# Patient Record
Sex: Male | Born: 1960 | Race: White | Hispanic: No | Marital: Married | Smoking: Never smoker
Health system: Southern US, Community
[De-identification: ages and names within clinical notes are randomized; demographics above are authoritative.]

---

## 1999-01-29 ENCOUNTER — Emergency Department (HOSPITAL_COMMUNITY): Admission: EM | Admit: 1999-01-29 | Discharge: 1999-01-29 | Payer: Self-pay | Admitting: Emergency Medicine

## 1999-01-29 ENCOUNTER — Encounter: Payer: Self-pay | Admitting: Emergency Medicine

## 2012-06-07 ENCOUNTER — Encounter (HOSPITAL_COMMUNITY): Payer: Self-pay | Admitting: *Deleted

## 2012-06-07 ENCOUNTER — Emergency Department (HOSPITAL_COMMUNITY)
Admission: EM | Admit: 2012-06-07 | Discharge: 2012-06-07 | Disposition: A | Discharge status: Home / Self Care | Payer: 59 | Attending: Emergency Medicine | Admitting: Emergency Medicine

## 2012-06-07 ENCOUNTER — Emergency Department (HOSPITAL_COMMUNITY): Payer: 59

## 2012-06-07 DIAGNOSIS — Y93H9 Activity, other involving exterior property and land maintenance, building and construction: Secondary | ICD-10-CM | POA: Insufficient documentation

## 2012-06-07 DIAGNOSIS — W298XXA Contact with other powered powered hand tools and household machinery, initial encounter: Secondary | ICD-10-CM | POA: Insufficient documentation

## 2012-06-07 DIAGNOSIS — S61209A Unspecified open wound of unspecified finger without damage to nail, initial encounter: Secondary | ICD-10-CM | POA: Insufficient documentation

## 2012-06-07 DIAGNOSIS — Y998 Other external cause status: Secondary | ICD-10-CM | POA: Insufficient documentation

## 2012-06-07 DIAGNOSIS — S61012A Laceration without foreign body of left thumb without damage to nail, initial encounter: Secondary | ICD-10-CM

## 2012-06-07 MED ORDER — TETANUS-DIPHTH-ACELL PERTUSSIS 5-2.5-18.5 LF-MCG/0.5 IM SUSP
0.5000 mL | Freq: Once | INTRAMUSCULAR | Status: AC
  Administered 2012-06-07: 0.5 mL via INTRAMUSCULAR
  Filled 2012-06-07: qty 0.5

## 2012-06-07 NOTE — ED Provider Notes (Signed)
History     CSN: [id number]  Arrival date & time 06/07/12  1153   First MD Initiated Contact with Patient 06/07/12 1208      Chief Complaint  Patient presents with  . Extremity Laceration    (Consider location/radiation/quality/duration/timing/severity/associated sxs/prior treatment) HPI Hx from pt. Dennis Olsen is a 51 y.o. male presenting with L thumb lac. States he was trimming hedges today when he accidentally caught his left thumb with the trimmer. Bleeding controlled. States he has minimal throbbing pain to the area. Denies numbness, weakness, difficulty moving the ext. Unsure of tetanus status.  History reviewed. No pertinent past medical history.  History reviewed. No pertinent past surgical history.  History reviewed. No pertinent family history.  History  Substance Use Topics  . Smoking status: Not on file  . Smokeless tobacco: Not on file  . Alcohol Use: Yes     occ      Review of Systems  Constitutional: Negative.   ros as per hpi  Allergies  Review of patient's allergies indicates no known allergies.  Home Medications  No current outpatient prescriptions on file.  BP 127/70  Pulse 56  Temp 97.4 F (36.3 C) (Oral)  Resp 20  SpO2 97%  Physical Exam  Nursing note and vitals reviewed. Constitutional: He appears well-developed and well-nourished. No distress.  HENT:  Head: Normocephalic and atraumatic.  Neck: Normal range of motion.  Cardiovascular: Normal rate.   Pulmonary/Chest: Effort normal.  Musculoskeletal: Normal range of motion.       Hands:      ~1.5 cm superficial appearing curvilinear lac to palmar surface of distal L thumb, bleeding controlled, clean appearing. Able to flex/ext thumb without difficulty, 5/5 strength at MCP/DIP. NVI distally, sensory intact to lt touch, cap refill <2 secs.  Neurological: He is alert.  Skin: Skin is warm and dry. He is not diaphoretic.  Psychiatric: He has a normal mood and affect.    ED Course    Procedures (including critical care time)  LACERATION REPAIR Performed by: Grant Fontana Authorized by: Grant Fontana Consent: Verbal consent obtained. Risks and benefits: risks, benefits and alternatives were discussed Consent given by: patient Patient identity confirmed: provided demographic data Prepped and Draped in normal sterile fashion Wound explored  Laceration Location: L thumb  Laceration Length: 1.5cm  No Foreign Bodies seen or palpated  Anesthesia: digital block  Local anesthetic: lidocaine 2% without epinephrine  Anesthetic total: 2 ml  Irrigation method: syringe Amount of cleaning: standard  Skin closure: 5-0 Prolene  Number of sutures: 7  Technique: simple interrupted  Patient tolerance: Patient tolerated the procedure well with no immediate complications.  Labs Reviewed - No data to display Dg Finger Thumb Left  06/07/2012  *RADIOLOGY REPORT*  Clinical Data: Laceration  LEFT THUMB 2+V  Comparison: None.  Findings: No radiodense foreign body. Negative for fracture, dislocation, or other acute abnormality.  Normal alignment and mineralization. No significant degenerative change.  Regional soft tissues unremarkable.  IMPRESSION:  Negative  Original Report Authenticated By: Thora Lance III, M.D.     1. Laceration of thumb, left       MDM  Pt presents with lac to the thumb. No evidence of tendon involvement, nvi distally with good cap refill. Imaging reviewed by me, no fx or evidence FB. Tet updated. Wound closed with sutures, pt tolerated well. Placed in bulky dressing. Instructed on wound care, suture removal. Reasons to return discussed. Pt verbalized understanding, agreed to plan.  Grant Fontana, PA-C 06/07/12 1345

## 2012-06-07 NOTE — ED Notes (Signed)
Pt reports trimming hedges today and cut left thumb, large laceration noted, bleeding controlled at this time.

## 2012-06-11 NOTE — ED Provider Notes (Signed)
Medical screening examination/treatment/procedure(s) were performed by non-physician practitioner and as supervising physician I was immediately available for consultation/collaboration.  Hurman Horn, MD 06/11/12 1224

## 2018-02-11 ENCOUNTER — Encounter: Payer: Self-pay | Admitting: Sports Medicine

## 2018-02-11 ENCOUNTER — Ambulatory Visit (INDEPENDENT_AMBULATORY_CARE_PROVIDER_SITE_OTHER): Payer: 59 | Admitting: Sports Medicine

## 2018-02-11 VITALS — BP 120/70 | Ht 76.0 in | Wt 195.0 lb

## 2018-02-11 DIAGNOSIS — S76211A Strain of adductor muscle, fascia and tendon of right thigh, initial encounter: Secondary | ICD-10-CM

## 2018-02-12 ENCOUNTER — Encounter: Payer: Self-pay | Admitting: Sports Medicine

## 2018-02-12 NOTE — Progress Notes (Signed)
   Subjective:    Patient ID: Dennis Olsen, male    DOB: 23-Sep-1961, 57 y.o.   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 While jumping up to hit an overhead shot, he felt a snap and pull in the right groin. He had immediate pain and was unable to continue playing. At the time of his injury, he did not notice any bruising or swelling. He has been self treating with ice and Aleve, and as a result, his symptoms have improved by about 75%. He localizes all of his pain to the groin with radiating pain down the medial aspect of the thigh. No lateral hip pain. No posterior hip pain. No numbness or tingling. His symptoms are most noticeable when leaning forward. He has no pain with walking. He was able to work out on the elliptical yesterday without any problem. He denies any previous injury to the hip. Denies knee pain.  Past medical history reviewed Medications reviewed Allergies reviewed    Review of Systems    as above Objective:   Physical Exam  Well-developed, well-nourished. No acute distress. Awake alert and oriented 3. Vital signs reviewed  Right hip: Smooth painless hip range of motion with a negative logroll. He is tender to palpation along the musculotendinous junction of the abductor muscle group. No swelling. No palpable defect. No ecchymosis. Reproducible pain with resisted hip adduction but no significant weakness. He does have some weakness with resisted hip abduction and a mildly positive Trendelenburg. He is neurovascularly intact distally. Walking without significant limp.       Assessment & Plan:   Right pain secondary to abductor muscle strain Hip abductor weakness  Since the patient is 75% improved, I think he will go on to complete resolution of symptoms rather quickly. We did give him some eccentric hip adductor exercises and  a compression sleeve to wear with activity. We also gave him hip abductor strengthening exercises and pelvic stabilizer exercises. He may continue with over-the-counter Aleve as needed. I think he is okay to wean back into activity as tolerated but I've cautioned him about returning to competitive tennis too soon. I think it may be a couple of weeks before he is able to return to competition on the tennis courts. No imaging was performed today but if symptoms persist or worsen then I would start with getting an MS[facility] ultrasound and possibly an x-ray.

## 2018-06-25 ENCOUNTER — Ambulatory Visit (INDEPENDENT_AMBULATORY_CARE_PROVIDER_SITE_OTHER): Payer: 59 | Admitting: Sports Medicine

## 2018-06-25 ENCOUNTER — Encounter: Payer: Self-pay | Admitting: Sports Medicine

## 2018-06-25 VITALS — BP 110/78 | Ht 76.0 in | Wt 200.0 lb

## 2018-06-25 DIAGNOSIS — M7701 Medial epicondylitis, right elbow: Secondary | ICD-10-CM

## 2018-06-25 NOTE — Patient Instructions (Signed)
Today we diagnosed you with medial epicondylitis. I am recommend eccentric strengthening exercises to be completed twice per day for 4 weeks. You may wear your strap as we discussed just past your medial epicondyle. You may also take anti-inflammatories as needed. Once you are improving, we may have Ellamae SiaJohn O'Halloran look at your golf swing in PT to help make adjustments and prevent this from reoccurring.

## 2018-06-25 NOTE — Progress Notes (Signed)
  Dennis Olsen - 57 y.o. male MRN [id number]  Date of birth: November 17, 1960   Chief complaint: R medial elbow pain  SUBJECTIVE:    History of present illness: Dennis Olsen is a 57 year old male with past medical history significant for medial epicondylitis approximately 1/2 years ago who presents today with a chief complaint of right-sided medial elbow pain.  His symptoms have been present for approximately 1 month and appeared not to be improving.  He states his pain is dull and located on the medial epicondyle region.  It is nonradiating.  He has had pain like this in the past.  His most recent episode was approximately 1/2 years ago which took almost a year to fully improve.  He has not tried any treatment today including icing or anti-inflammatories.  He has modified his activities in the gym to the point where he is no longer doing biceps curls as they were acutely painful.  He also tried CBD oil which did not work. He is an avid Armed forces operational officertennis player, golfer, and weight lifter on a weekly basis.  He usually hits 9-18 holes per week and plays 1-2 games of tennis per week.  He has purchased a elbow strap however he has not yet worn it.  He denies any significant weakness of the right upper extremity.  Denies any numbness or tingling.  Denies any pain at the wrist or at the shoulder.  No one injury that he remembers.   Review of systems:  As stated above   Interval past medical history, surgical history, family history, and social history obtained and are unchanged. PMH: none. Nonsmoker.  OBJECTIVE:  Physical exam: Vital signs are reviewed. BP 110/78   Ht 6\' 4"  (1.93 m)   Wt 200 lb (90.7 kg)   BMI 24.34 kg/m   Gen.: Alert, oriented, appears stated age, in no apparent distress Respiratory: Normal respirations, able to speak in full sentences Cardiac: Regular rate, distal pulses 2+ Musculoskeletal: No obvious abnormality upon inspection.    ASSESSMENT & PLAN: Medial epicondylitis of right elbow -  injury related to overuse - recommend eccentric exercises which were given and should be performed 2x day for 4 weeks. If unable to perform at home, recommend physical therapy for this - afterwards, recommend apt with Ellamae SiaJohn O'Halloran to review golf swing for preventative means - may wear forearm brace with activities - avoid chin ups or supination curls, may try hammer curls - may take ibuprofen as needed for pain or discomfort - If slow to improve, will consider US at next apt - f/u in 4 weeks   Gustavus MessingAJ Pinney, DO Sports Medicine Fellow [facility]

## 2018-06-25 NOTE — Assessment & Plan Note (Signed)
-   injury related to overuse - recommend eccentric exercises which were given and should be performed 2x day for 4 weeks. If unable to perform at home, recommend physical therapy for this - afterwards, recommend apt with Ellamae SiaJohn O'Halloran to review golf swing for preventative means - may wear forearm brace with activities - avoid chin ups or supination curls, may try hammer curls - may take ibuprofen as needed for pain or discomfort - If slow to improve, will consider US at next apt - f/u in 4 weeks

## 2019-09-29 ENCOUNTER — Encounter: Payer: 59 | Admitting: Sports Medicine

## 2019-10-06 ENCOUNTER — Encounter: Payer: Self-pay | Admitting: Sports Medicine

## 2019-10-06 ENCOUNTER — Ambulatory Visit (INDEPENDENT_AMBULATORY_CARE_PROVIDER_SITE_OTHER): Payer: 59 | Admitting: Sports Medicine

## 2019-10-06 ENCOUNTER — Other Ambulatory Visit: Payer: Self-pay

## 2019-10-06 VITALS — BP 110/78 | Ht 76.0 in | Wt 200.0 lb

## 2019-10-06 DIAGNOSIS — M2142 Flat foot [pes planus] (acquired), left foot: Secondary | ICD-10-CM

## 2019-10-06 DIAGNOSIS — M25561 Pain in right knee: Secondary | ICD-10-CM

## 2019-10-06 DIAGNOSIS — M25562 Pain in left knee: Secondary | ICD-10-CM | POA: Diagnosis not present

## 2019-10-06 DIAGNOSIS — M2141 Flat foot [pes planus] (acquired), right foot: Secondary | ICD-10-CM | POA: Diagnosis not present

## 2019-10-06 NOTE — Progress Notes (Signed)
PCP: Gaynelle Arabian, MD  Subjective:   HPI: Patient is a 58 y.o. male here for evaluation of bilateral knee pain.  Patient notes his pain is been present for the last month.  He denies any specific injury or trauma.  Pain is located in and around his kneecap.  Patient notes the pain worsens with squatting as well as with playing tennis.  The pain does not radiate.  He denies any bruising or swelling.  He denies any numbness or tingling.  Patient has not done any therapies for his pain other than taking over-the-counter anti-inflammatories.  He notes this minimally helps improve his pain.  He denies any locking, catching, instability of the knee.  He does not feel like the knee will give out on him.  The pain has a sharp stabbing quality to it.  Patient's current pain is a 1 out of 10 but can get severe enough to where he is not able to play tennis.   Review of Systems: See HPI above.  History reviewed. No pertinent past medical history.  No current outpatient medications on file prior to visit.   No current facility-administered medications on file prior to visit.     History reviewed. No pertinent surgical history.  No Known Allergies  Social History   Socioeconomic History  . Marital status: Single    Spouse name: Not on file  . Number of children: Not on file  . Years of education: Not on file  . Highest education level: Not on file  Occupational History  . Not on file  Social Needs  . Financial resource strain: Not on file  . Food insecurity    Worry: Not on file    Inability: Not on file  . Transportation needs    Medical: Not on file    Non-medical: Not on file  Tobacco Use  . Smoking status: Never Smoker  . Smokeless tobacco: Never Used  Substance and Sexual Activity  . Alcohol use: Yes    Comment: occ  . Drug use: No  . Sexual activity: Not on file  Lifestyle  . Physical activity    Days per week: Not on file    Minutes per session: Not on file  . Stress:  Not on file  Relationships  . Social Herbalist on phone: Not on file    Gets together: Not on file    Attends religious service: Not on file    Active member of club or organization: Not on file    Attends meetings of clubs or organizations: Not on file    Relationship status: Not on file  . Intimate partner violence    Fear of current or ex partner: Not on file    Emotionally abused: Not on file    Physically abused: Not on file    Forced sexual activity: Not on file  Other Topics Concern  . Not on file  Social History Narrative  . Not on file    History reviewed. No pertinent family history.      Objective:  Physical Exam: BP 110/78   Ht 6\' 4"  (1.93 m)   Wt 200 lb (90.7 kg)   BMI 24.34 kg/m  Gen: NAD, comfortable in exam room Lungs: Breathing comfortably on room air Knee Exam Right -Inspection: no deformity, no discoloration -Palpation: No tenderness palpation -ROM: Extension: -10 degrees; Flexion: 150 degrees.  Crepitus with range of motion testing -Strength: Extension: 5/5; Flexion: 5/5 -Special Tests: Varus Stress:  Negative; Valgus Stress: Negative; Lachman: Negative; Posterior drawer: Negative; McMurray: Negative; Thessaly: Negative -Limb neurovascularly intact, no instability noted  Contralateral Knee -Inspection: no deformity, no discoloration -Palpation: No tenderness palpation -ROM: Extension: -10 degrees; Flexion: 150 degrees.  Crepitus with range of motion testing -Strength: Extension: 5/5; Flexion: 5/5 -Special Tests: Varus Stress: Negative; Valgus Stress: Negative; Lachman: Negative; Posterior drawer: Negative; McMurray: Negative; Thessaly: Negative -Limb neurovascularly intact, no instability noted  Bilateral feet: Pes planus deformity   Diagnostic ultrasound of the bilateral knees Findings: -Normal amount of fluid noted within the suprapatellar pouch bilaterally -Normal appearance of the quadriceps tendon bilaterally -Thickening of  the patellar tendon along with hypoechoic changes noted in the right knee.  Left patellar tendon is normal -Normal appearance of the medial meniscus bilaterally.  No spurring noted -Small bone spurs noted in the right lateral joint line.  Normal appearance of the right lateral meniscus -Normal appearance of the left lateral meniscus -No Baker's cyst noted in the popliteal fossa bilaterally Impression: -Mild thickening and hypoechoic changes of the right patellar tendon.  Otherwise normal ultrasound examination of the bilateral knees    Assessment & Plan:  Patient is a 58 y.o. male here for evaluation of bilateral knee pain  1.  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  Will call the patient with the results after this is done -Patient was given knee strengthening exercises  2.  Bilateral pes planus deformity Patient was fitted for a : standard, cushioned, semi-rigid orthotic. The orthotic was heated and afterward the patient stood on the orthotic blank positioned on the orthotic stand. The patient was positioned in subtalar neutral position and 10 degrees of ankle dorsiflexion in a weight bearing stance. After completion of molding, a stable base was applied to the orthotic blank. The blank was ground to a stable position for weight bearing. Size: 13 Base: Blue medium density EVA Posting: None Additional orthotic padding: None -Patient was instructed to wear the orthotics for the next several weeks.  If he develops any discomfort and then he will come back for adjustments as needed  We will call patient with results of the x-ray and discuss further management follow-up at that time  Patient seen and evaluated with the sports  medicine fellow.  I agree with the above plan of care.  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  I discussed avoiding squats and lunges if they become too painful.  He also has pes planus so I think he would benefit from arch support.  Custom orthotics were created today for him.  He found them comfortable prior to leaving the office.  He will get x-rays of his knee and I will call him with those results when available.  He was also instructed in isometric quad exercises.  I think he is okay to continue with activity using pain as his guide.

## 2019-10-09 ENCOUNTER — Ambulatory Visit
Admission: RE | Admit: 2019-10-09 | Discharge: 2019-10-09 | Disposition: A | Discharge status: Home / Self Care | Payer: 59 | Source: Ambulatory Visit | Attending: Sports Medicine | Admitting: Sports Medicine

## 2019-10-09 DIAGNOSIS — M25562 Pain in left knee: Secondary | ICD-10-CM

## 2019-10-09 DIAGNOSIS — M25561 Pain in right knee: Secondary | ICD-10-CM

## 2019-10-12 ENCOUNTER — Telehealth: Payer: Self-pay | Admitting: Sports Medicine

## 2019-10-12 NOTE — Telephone Encounter (Signed)
  I left a message on the patient's voicemail today regarding x-ray findings of his knees.  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  I reiterated the importance of avoiding squats and lunges as much as possible which will likely aggravate his symptoms.  If pain persists despite custom orthotics and home exercises then we could consider cortisone injections.  Follow-up as needed.

## 2019-10-28 ENCOUNTER — Ambulatory Visit: Payer: 59 | Attending: Internal Medicine

## 2019-10-28 DIAGNOSIS — Z20822 Contact with and (suspected) exposure to covid-19: Secondary | ICD-10-CM

## 2019-10-31 LAB — NOVEL CORONAVIRUS, NAA: SARS-CoV-2, NAA: NOT DETECTED

## 2020-02-02 ENCOUNTER — Other Ambulatory Visit: Payer: Self-pay

## 2020-02-02 ENCOUNTER — Ambulatory Visit (INDEPENDENT_AMBULATORY_CARE_PROVIDER_SITE_OTHER): Payer: 59 | Admitting: Family Medicine

## 2020-02-02 VITALS — BP 120/78 | Ht 76.0 in | Wt 200.0 lb

## 2020-02-02 DIAGNOSIS — M79672 Pain in left foot: Secondary | ICD-10-CM | POA: Diagnosis not present

## 2020-02-02 NOTE — Progress Notes (Signed)
PCP: Blair Heys, MD  Subjective:   HPI: Patient is a 59 y.o. male here for evaluation of left foot pain.  He complains of dorsomedial foot pain, that is worse with plantarflexion while walking. He was walking 5-6 miles about a month ago when the pain began and it has continued to bother him while he plays golf or exercises. He has not taken any medications for it and the only thing that helps is rest.  Patient was fitted with standard, cushioned, semi rigid orthotics in December 2020 for bilateral pes planus deformity.  No past medical history on file.  No current outpatient medications on file prior to visit.   No current facility-administered medications on file prior to visit.    No past surgical history on file.  No Known Allergies  Social History   Socioeconomic History  . Marital status: Single    Spouse name: Not on file  . Number of children: Not on file  . Years of education: Not on file  . Highest education level: Not on file  Occupational History  . Not on file  Tobacco Use  . Smoking status: Never Smoker  . Smokeless tobacco: Never Used  Substance and Sexual Activity  . Alcohol use: Yes    Comment: occ  . Drug use: No  . Sexual activity: Not on file  Other Topics Concern  . Not on file  Social History Narrative  . Not on file   Social Determinants of Health   Financial Resource Strain:   . Difficulty of Paying Living Expenses:   Food Insecurity:   . Worried About Programme researcher, broadcasting/film/video in the Last Year:   . Barista in the Last Year:   Transportation Needs:   . Freight forwarder (Medical):   Marland Kitchen Lack of Transportation (Non-Medical):   Physical Activity:   . Days of Exercise per Week:   . Minutes of Exercise per Session:   Stress:   . Feeling of Stress :   Social Connections:   . Frequency of Communication with Friends and Family:   . Frequency of Social Gatherings with Friends and Family:   . Attends Religious Services:   . Active  Member of Clubs or Organizations:   . Attends Banker Meetings:   Marland Kitchen Marital Status:   Intimate Partner Violence:   . Fear of Current or Ex-Partner:   . Emotionally Abused:   Marland Kitchen Physically Abused:   . Sexually Abused:     No family history on file.  BP 120/78   Ht 6\' 4"  (1.93 m)   Wt 200 lb (90.7 kg)   BMI 24.34 kg/m   Review of Systems: See HPI above.     Objective:  Physical Exam:  Gen: NAD, comfortable in exam room  Right foot/ankle:  No erythema, swelling or ecchymosis. TTP on medial foot around navicular insertion of posterior tib FROM Strength 5/5 with Dorsiflex/plantarflex/eversion/inversion Sensation intact in DP/PT/Sural/Saphenous regions No pain with great toe extension/flexion   Assessment & Plan:  1. Posterior tibial tendonopathy: With his pain on plantarflexion, history of pes planus and TTP of the navicular bone, he likely has posterior tibial tendonopathy. - Add small scaphoid arch support to orthotics bilaterally - Posterior tibial rehab exercises

## 2020-02-02 NOTE — Patient Instructions (Signed)
We added small scaphoid pads to your orthotics to unload your posterior tibialis tendons a little more. Start the home exercises as directed daily as well. Let me or Dr. Margaretha Sheffield know how you're doing in about 2-4 weeks (mychart message or call). If you do well with these the pads would need to be replaced about every 5-6 months. If the pads bother you too much, you can peel these off.

## 2020-02-03 ENCOUNTER — Encounter: Payer: Self-pay | Admitting: Family Medicine

## 2021-03-02 ENCOUNTER — Ambulatory Visit (INDEPENDENT_AMBULATORY_CARE_PROVIDER_SITE_OTHER): Payer: 59 | Admitting: Family Medicine

## 2021-03-02 ENCOUNTER — Other Ambulatory Visit: Payer: Self-pay

## 2021-03-02 VITALS — BP 110/78 | Ht 76.0 in | Wt 190.0 lb

## 2021-03-02 DIAGNOSIS — M79672 Pain in left foot: Secondary | ICD-10-CM

## 2021-03-02 NOTE — Progress Notes (Signed)
Office Visit Note   Patient: Dennis Olsen           Date of Birth: 03-07-1961           MRN: [id number] Visit Date: 03/02/2021 Requested by: Blair Heys, MD [address] [place],  [place] [address] PCP: Blair Heys, MD  Subjective: CC: Left Foot Pain  HPI: 60yo M presenting to clinic with concerns of acute on chronic left foot pain. Patient states that he has struggled with left foot pain for several years, however this pain is atypical from his usual pain. Says that he is very active throughout the day, typically walking 5-6 miles daily, as well as playing golf at most available opportunities. He says that he has been compliant with his previous made custom orthotics, which usually helps alleviate his pain, however he cannot wear them in his work boots. Over the past week, he has started to experience a new pain in the center of his left foot. He isn't sure what he did to aggravate it, but says that about two days ago "It was so bad I couldn't hardly walk." He tried to rest it, and does feel like it's starting to improve today. He says he hasn't had pain quite like this before, as usually his symptoms are more dorsal or lateral on his ankle. No trauma.               ROS:   All other systems were reviewed and are negative.  Objective: Vital Signs: BP 110/78   Ht 6\' 4"  (1.93 m)   Wt 190 lb (86.2 kg)   BMI 23.13 kg/m  [facility] Exercise 03/02/2021  Frequency of aerobic exercise (# of days/week) 0  Average time in minutes 0  Frequency of strengthening activities (# of days/week) 4     No flowsheet data found.  Physical Exam:  General:  Alert and oriented, in no acute distress. Pulm:  Breathing unlabored. Psy:  Normal mood, congruent affect. Skin:  Left foot without bruises, rashes, or erythema. Overlying skin intact.   Left Foot Exam:  General assessment: Minimally antalgic gait, with slightly decreased stance phase on left. Inspection:  Mild pes planus bilaterally.  No significant swelling or deformity.  Seated Exam: Ankle motion: ankle and all toes with full ROM without pain.  Palpation: Endorses significant tenderness specifically over medial aspect of navicular bone on the left foot. No pain with calf or Achilles squeeze.  No Tenderness over the peroneal tendons. No tenderness along plantar fascia. No pain with metatarsal squeeze or along metatarsal heads. No pain along 5th metatarsal.   Strength: 5/5 in eversion, inversion, and plantar/dorsiflexion Normal distal sensation   Imaging: Limited Extremity 03/04/2021: Left Midfoot Left navicular bone at area of maximal tenderness does display effusion surrounding bone, with slightly increased vascular flow. No obvious cortical irregularities.   Impression: Concerning for early stress injury of left Navicular  Assessment & Plan: 60 year old male presenting to clinic with concerns of new left foot pain.  Examination concerning for pinpoint tenderness specifically over the left navicular prominence, which is furthered by ultrasound with effusion and increased vascular flow in this area.  Suspect navicular stress injury, though it is very reassuring that his symptoms have improved in the past 24 hours. -Recommend offloading this area with crutches, which patient states he has at home. -Avoid his typical 6 mile daily walks, as well as his planned golf outings this weekend to allow his foot to  recover. -When he returns to clinic, he should bring his work boots with him for evaluation and trial of thin orthotics in this area. -Patient declines the need for anti-inflammatories at this time. -Return to clinic in 1 week for reevaluation. -Patient expresses understanding with plan.  He had no further questions or concerns.   I was a preceptor for this visit and available for immediate consultation.  Ultrasound does show edema around the area of the navicular.  He states his pain has improved  tremendously in the past 24 hours.  Therefore, we will proceed with treatment as above and he will follow-up in 1 week.

## 2021-03-02 NOTE — Patient Instructions (Signed)
Thank you for visiting [place] today!  It looks like you may have an early stress reaction in your left foot.  It is very important that you rest this area to allow this to heal.  If improperly rested, and may progress to a stress fracture, which could require prolonged healing and immobilization.  Return to clinic in 1 week for reevaluation.

## 2021-03-06 ENCOUNTER — Ambulatory Visit: Payer: 59 | Admitting: Family Medicine

## 2021-03-13 ENCOUNTER — Ambulatory Visit: Payer: 59 | Admitting: Family Medicine

## 2021-03-13 ENCOUNTER — Other Ambulatory Visit: Payer: Self-pay

## 2021-03-13 ENCOUNTER — Encounter: Payer: Self-pay | Admitting: Family Medicine

## 2021-03-13 ENCOUNTER — Ambulatory Visit (INDEPENDENT_AMBULATORY_CARE_PROVIDER_SITE_OTHER): Payer: 59 | Admitting: Family Medicine

## 2021-03-13 DIAGNOSIS — M79672 Pain in left foot: Secondary | ICD-10-CM

## 2021-03-13 NOTE — Patient Instructions (Addendum)
Your navicular is no longer tender like it was last visit. Your pain is localized to the extensor hallucis tendon. Icing 15 minutes at a time as needed. Home exercises as directed for the next 4 weeks. Topical voltaren gel up to 4 times a day to help with pain and inflammation. Hard soled shoes can help if this continues to be a problem (you would still put the insert in them though). Continue with the orthotics as you have been - we changed the metatarsal pad today. [employer] does make a 3/4 length insole that I think would work for you - check at their website to get the correct size and order from there. Follow up with [place] as needed if you continue to improve.

## 2021-03-13 NOTE — Progress Notes (Signed)
PCP: Blair Heys, MD  Subjective:   HPI: Patient is a 60 y.o. male here for left foot pain.  Left foot pain on top of foot x3 to 4 days.  Occurs mostly with walking and golfing. Recent medial foot pain has since improved. Wears orthotics with most shoes but having issue with fitting them in dress shoes. No new injuries.  Worse with dorsiflexion of left foot/ankle.  No past medical history on file.  No current outpatient medications on file prior to visit.   No current facility-administered medications on file prior to visit.    No past surgical history on file.  No Known Allergies  Social History   Socioeconomic History  . Marital status: Single    Spouse name: Not on file  . Number of children: Not on file  . Years of education: Not on file  . Highest education level: Not on file  Occupational History  . Not on file  Tobacco Use  . Smoking status: Never Smoker  . Smokeless tobacco: Never Used  Substance and Sexual Activity  . Alcohol use: Yes    Comment: occ  . Drug use: No  . Sexual activity: Not on file  Other Topics Concern  . Not on file  Social History Narrative  . Not on file   Social Determinants of Health   Financial Resource Strain: Not on file  Food Insecurity: Not on file  Transportation Needs: Not on file  Physical Activity: Not on file  Stress: Not on file  Social Connections: Not on file  Intimate Partner Violence: Not on file    No family history on file.  BP 122/80   Ht 6\' 4"  (1.93 m)   Wt 190 lb (86.2 kg)   BMI 23.13 kg/m   [facility] Exercise 03/02/2021  Frequency of aerobic exercise (# of days/week) 0  Average time in minutes 0  Frequency of strengthening activities (# of days/week) 4    No flowsheet data found.  Review of Systems: See HPI above.     Objective:  Physical Exam:  Gen: NAD, comfortable in exam room  Left foot No gross deformity, swelling, ecchymoses. FROM TTP over extensor hallucis  mildly.  No other TTP over medial, lateral, or dorsum of foot Negative ant drawer and Negative talar tilt.   Pain reproduced with resisted motion of extensor hallucis tendon.  NV intact distally.   Assessment & Plan:  1. Left foot pain Exam consistent with extensor hallucis tendinopathy.  No risk factors, tenderness to suggest stress fracture. -Ice, home exercises, voltaren gel  -Continue with current orthotics- changed metatarsal pad today -Consider 3/4 length insole for dress shoes -Follow up PRN if improvement continues

## 2021-03-16 ENCOUNTER — Ambulatory Visit: Payer: 59 | Admitting: Family Medicine

## 2021-03-28 ENCOUNTER — Ambulatory Visit: Payer: 59 | Admitting: Family Medicine

## 2021-04-24 NOTE — Progress Notes (Signed)
[facility] [address] [place] [address] Phone: [phone] Subjective:   Dennis Olsen, am serving as a scribe for Dr. Antoine Primas. This visit occurred during the SARS-CoV-2 public health emergency.  Safety protocols were in place, including screening questions prior to the visit, additional usage of staff PPE, and extensive cleaning of exam room while observing appropriate contact time as indicated for disinfecting solutions.    I'm seeing this patient by the request  of:  Blair Heys, MD  CC: Low back pain   FBP:[id number]  Dennis Olsen is a 60 y.o. male coming in with complaint of L hamstring and LBP. Patient states that he had back pain in March that lasted for months. Tore hamstring in February and he feels like his back has bothered him since. Back pain has improved recently. Denies any radiating pain. Hamstring still bothering him with pain radiating into origin. Is working with Systems analyst to strengthening and stretch. Tried squatting the other day and felt like both adductors were about to pull. Denies any dehydration that day of his workout.   Have seen other providers for what appeared to be more of a strain of the adductor in 2019.  No past medical history on file. No past surgical history on file. Social History   Socioeconomic History   Marital status: Married    Spouse name: Not on file   Number of children: Not on file   Years of education: Not on file   Highest education level: Not on file  Occupational History   Not on file  Tobacco Use   Smoking status: Never   Smokeless tobacco: Never  Substance and Sexual Activity   Alcohol use: Yes    Comment: occ   Drug use: No   Sexual activity: Not on file  Other Topics Concern   Not on file  Social History Narrative   Not on file   Social Determinants of Health   Financial Resource Strain: Not on file  Food Insecurity: Not on file  Transportation Needs:  Not on file  Physical Activity: Not on file  Stress: Not on file  Social Connections: Not on file   No Known Allergies No family history on file.   Current Outpatient Medications (Cardiovascular):    sildenafil (VIAGRA) 100 MG tablet, 1/2-1 tablet     Current Outpatient Medications (Other):    ketoconazole (NIZORAL) 2 % shampoo, Apply topically every other day.   Reviewed prior external information including notes and imaging from  primary care provider As well as notes that were available from care everywhere and other healthcare systems.  Past medical history, social, surgical and family history all reviewed in electronic medical record.  No pertanent information unless stated regarding to the chief complaint.   Review of Systems:  No headache, visual changes, nausea, vomiting, diarrhea, constipation, dizziness, abdominal pain, skin rash, fevers, chills, night sweats, weight loss, swollen lymph nodes, body aches, joint swelling, chest pain, shortness of breath, mood changes. POSITIVE muscle aches  Objective  Blood pressure 130/88, pulse 93, height 6\' 4"  (1.93 m), weight 204 lb (92.5 kg), SpO2 96 %.   General: No apparent distress alert and oriented x3 mood and affect normal, dressed appropriately.  HEENT: Pupils equal, extraocular movements intact  Respiratory: Patient's speak in full sentences and does not appear short of breath  Cardiovascular: No lower extremity edema, non tender, no erythema  Gait normal with good balance and coordination.  MSK:  Non tender with full range of motion and good stability and symmetric strength and tone of shoulders, elbows, wrist, hip, knee and ankles bilaterally.  Low back exam shows significant loss of lordosis.  Patient does have very mild tightness in sidebending and flexion.  Severe tightness noted of the hamstrings left greater than right.  Tender to palpation over the ischial tuberosity.  Patient does have some very mild weakness but  still 4+ out of 5 strength in the left hamstring against resisted flexion compared to the contralateral side.  Limited musculoskeletal ultrasound was performed and interpreted Judi Saa  Limited ultrasound of patient's left hamstring at the ischial tuberosity shows what appears to be a nondisplaced avulsion fracture with a potential callus formation noted.  Patient's tendon appears to be no significant intrasubstance tearing noted.  No increase in Doppler flow. Impression: Likely nondisplaced avulsion fracture noted.   Impression and Recommendations:     The above documentation has been reviewed and is accurate and complete Judi Saa, DO

## 2021-04-25 ENCOUNTER — Other Ambulatory Visit: Payer: Self-pay

## 2021-04-25 ENCOUNTER — Ambulatory Visit (INDEPENDENT_AMBULATORY_CARE_PROVIDER_SITE_OTHER): Payer: 59 | Admitting: Family Medicine

## 2021-04-25 ENCOUNTER — Encounter: Payer: Self-pay | Admitting: Family Medicine

## 2021-04-25 ENCOUNTER — Ambulatory Visit (INDEPENDENT_AMBULATORY_CARE_PROVIDER_SITE_OTHER): Payer: 59

## 2021-04-25 ENCOUNTER — Ambulatory Visit: Payer: Self-pay

## 2021-04-25 VITALS — BP 130/88 | HR 93 | Ht 76.0 in | Wt 204.0 lb

## 2021-04-25 DIAGNOSIS — M545 Low back pain, unspecified: Secondary | ICD-10-CM | POA: Diagnosis not present

## 2021-04-25 DIAGNOSIS — M25552 Pain in left hip: Secondary | ICD-10-CM

## 2021-04-25 NOTE — Patient Instructions (Addendum)
Xray today Tell trainer nondisplaced avulsion fracture of proximal hamstring Thigh compression sleeve with any work out or a lot of walking Ice 20 minutes 2 times daily. Usually after activity and before bed. Exercises 3 times a week.  Vitamin D 2000 IU daily over the counter K2 daily for 30 days then stop See me again in 6 weeks to make sure much better and potentially get you back to hig impact things

## 2021-06-09 ENCOUNTER — Ambulatory Visit: Payer: 59 | Admitting: Family Medicine

## 2021-07-18 IMAGING — DX DG LUMBAR SPINE 2-3V
3 series · 3 of 3 positions shown · non-contrast
Comparison: None.

CLINICAL DATA: 60-year-old male with low back pain.

EXAM:
LUMBAR SPINE - 2-3 VIEW

[l-spine ap]
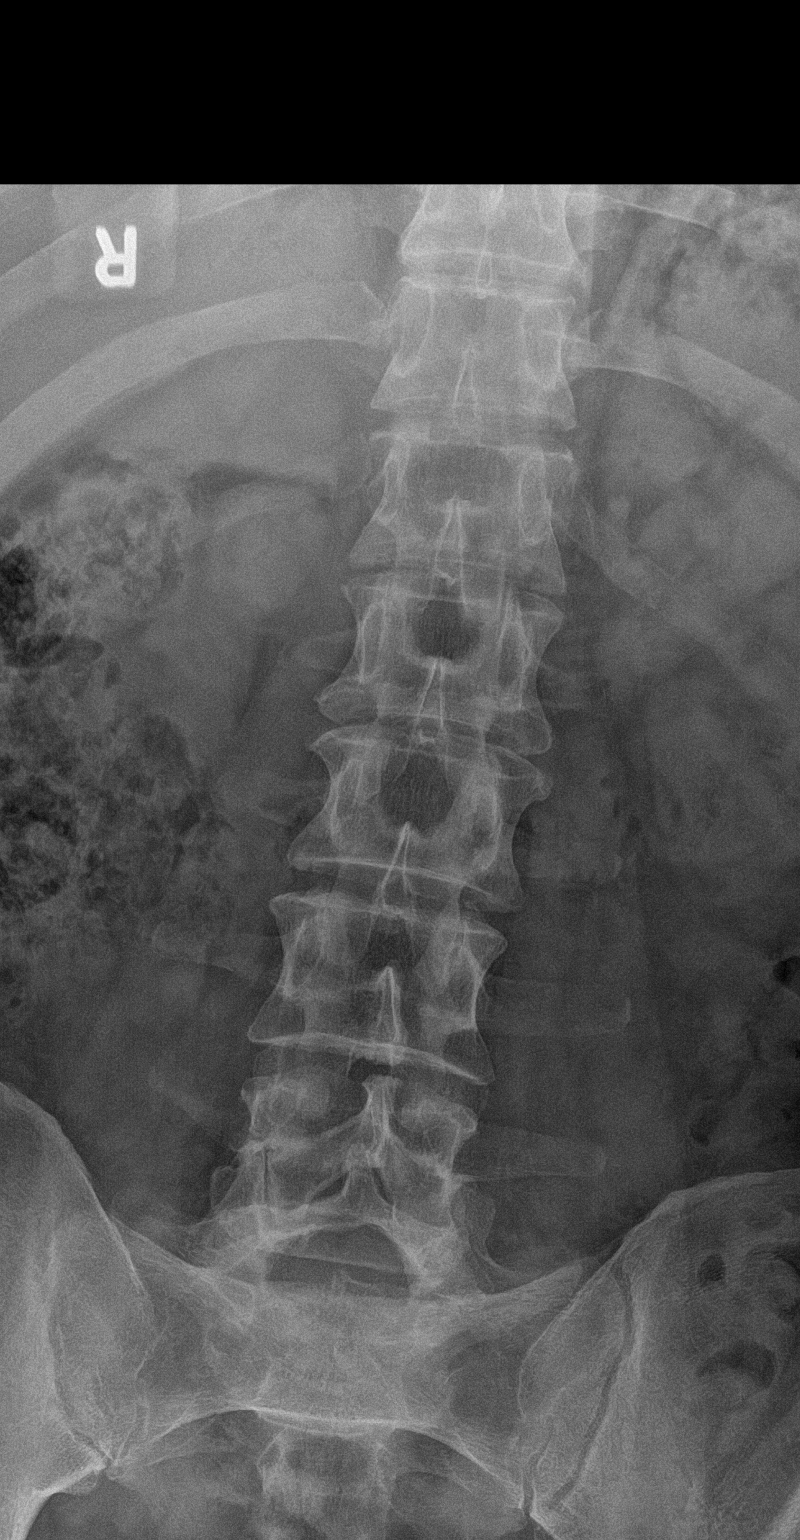

[l-spine lateral (1 of 2)]
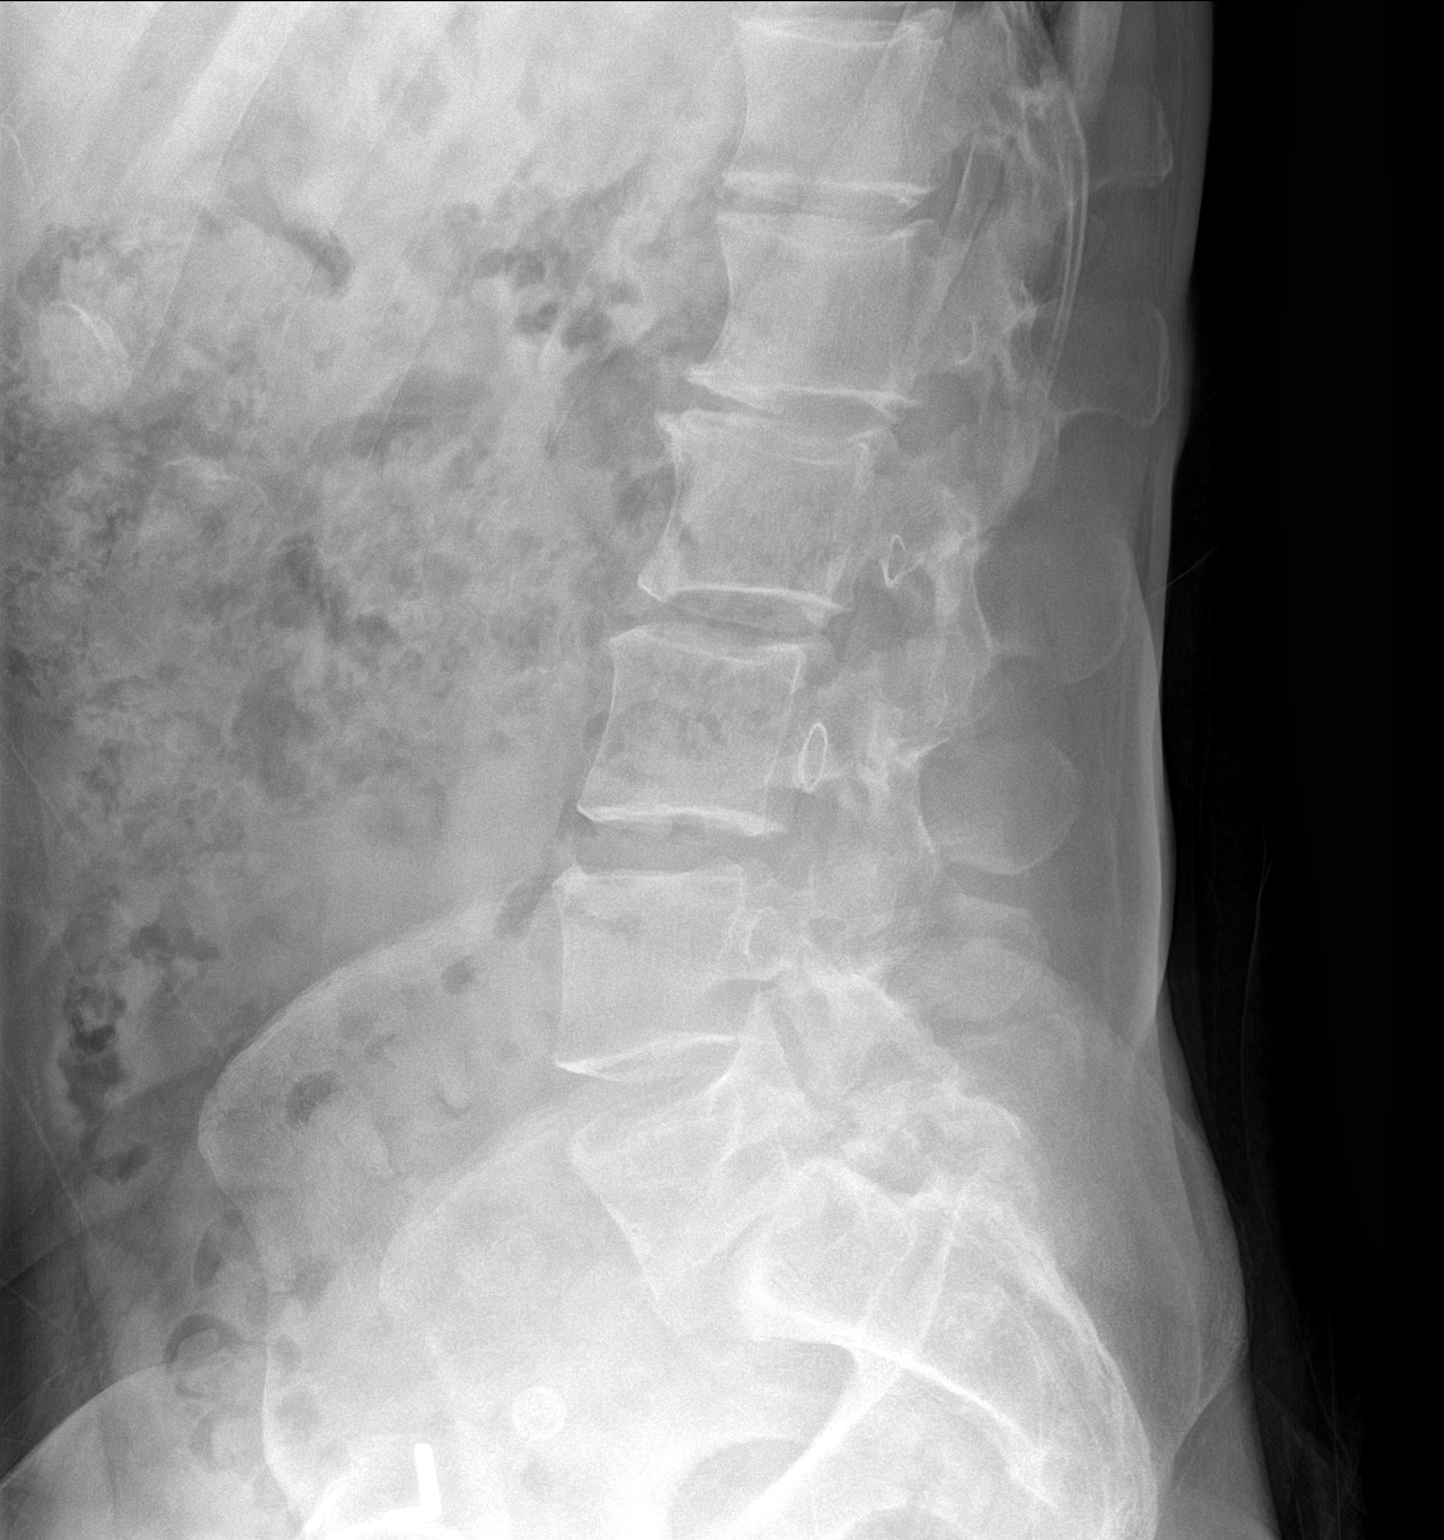

[l-spine lateral (2 of 2)]
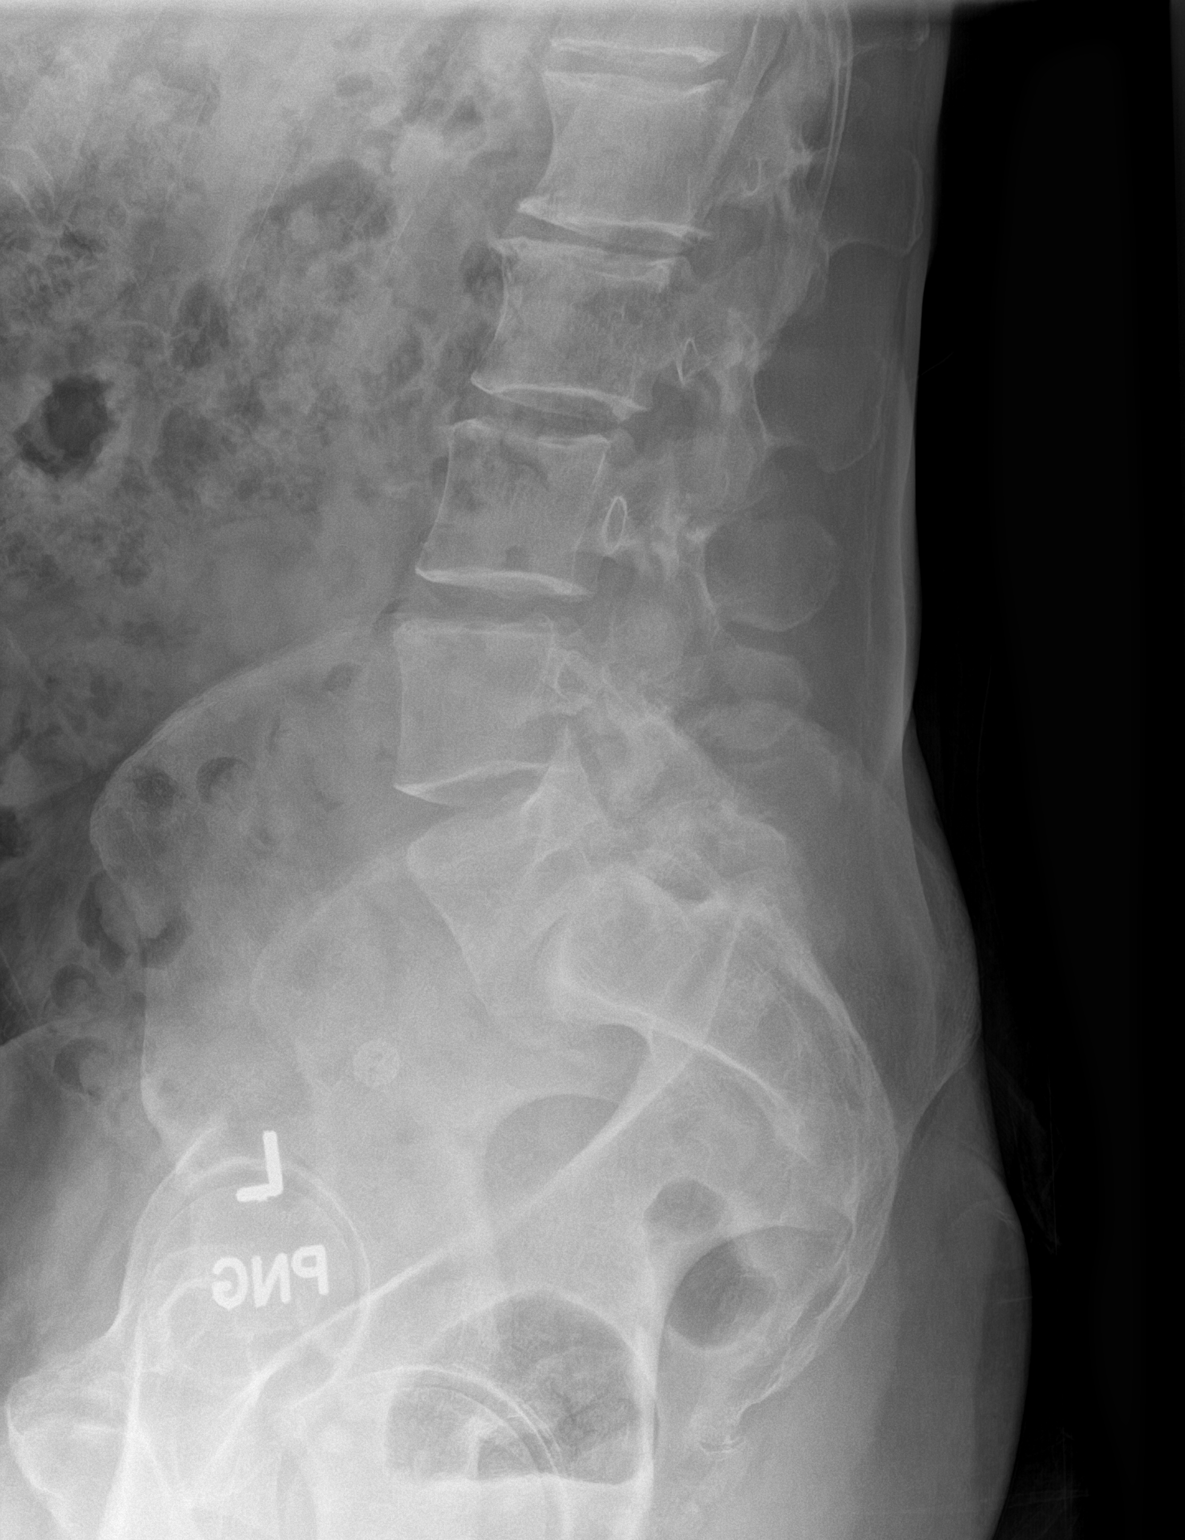

[3 of 3 positions shown; findings below may reference images not displayed]

FINDINGS: Five lumbar type vertebra. There is no acute fracture or subluxation
of the lumbar spine. There is L5-S1 disc space narrowing and
probable bilateral L5 pars defects with a degree of listhesis. CT or
MRI may provide better evaluation. The visualized posterior elements
are otherwise intact. The soft tissues are unremarkable.
IMPRESSION: 1. No acute fracture or subluxation of the lumbar spine.
2. Probable bilateral L5 pars defects with L5-S1 disc space
narrowing and listhesis.

## 2023-07-24 ENCOUNTER — Other Ambulatory Visit: Payer: Self-pay | Admitting: Dentistry

## 2023-07-24 DIAGNOSIS — M26632 Articular disc disorder of left temporomandibular joint: Secondary | ICD-10-CM

## 2023-07-28 ENCOUNTER — Ambulatory Visit
Admission: RE | Admit: 2023-07-28 | Discharge: 2023-07-28 | Disposition: A | Discharge status: Home / Self Care | Payer: 59 | Source: Ambulatory Visit | Attending: Dentistry | Admitting: Dentistry

## 2023-07-28 DIAGNOSIS — M26632 Articular disc disorder of left temporomandibular joint: Secondary | ICD-10-CM

## 2024-01-31 ENCOUNTER — Ambulatory Visit: Admitting: Sports Medicine

## 2024-01-31 VITALS — BP 130/80 | Ht 76.0 in | Wt 200.0 lb

## 2024-01-31 DIAGNOSIS — G8929 Other chronic pain: Secondary | ICD-10-CM | POA: Diagnosis not present

## 2024-01-31 DIAGNOSIS — M25512 Pain in left shoulder: Secondary | ICD-10-CM

## 2024-01-31 DIAGNOSIS — M7712 Lateral epicondylitis, left elbow: Secondary | ICD-10-CM | POA: Diagnosis not present

## 2024-01-31 MED ORDER — NITROGLYCERIN 0.2 MG/HR TD PT24: MEDICATED_PATCH | TRANSDERMAL | 1 refills | Status: AC

## 2024-01-31 NOTE — Patient Instructions (Signed)
 You have rotator cuff strain/tendinopathy Try to avoid painful activities (overhead activities, lifting with extended arm) as much as possible. I did send nitroglycerin patches to use every 24 hours over the shoulder and lateral elbow. Place these over the most sore spot. See below Ibuprofen with food for pain and inflammation as needed. Subacromial injection may be beneficial to help with pain and to decrease inflammation if failing to improve with rehab. Do home exercise program with theraband and jobe exercises daily 3 sets of 10 once a day. If not improving at follow-up we will consider further imaging, injection, physical therapy, and/or nitro patches. Follow up with me in 6-8 weeks.  Nitroglycerin Protocol Apply 1/4 nitroglycerin patch to affected area daily. Change position of patch slightly within the affected area every 24 hours. You may experience a headache during the first 1-2 weeks of using the patch, these should subside. If you experience headaches after beginning nitroglycerin patch treatment, you may take your preferred over the counter pain reliever. Another side effect of the nitroglycerin patch is skin irritation or rash related to patch adhesive. Please notify our office if you develop more severe headaches or rash, and stop the patch. Tendon healing with nitroglycerin patch may require 12 to 24 weeks depending on the extent of injury. Men should not use if taking Viagra, Cialis, or Levitra.  Do not use if you have migraines or rosacea.

## 2024-01-31 NOTE — Progress Notes (Signed)
   PCP: Blair Heys, MD (Inactive)  SUBJECTIVE:   HPI:  Patient is a 63 y.o. male here with chief complaint of left shoulder pain.  He is RHD and reports achy pain and weakness of his left shoulder for 6 months. Denies any known injury, but noticed after starting floor exercises while dealing with TMJ. His pain is in the left lateral shoulder and denies radiculopathy down the left arm. He also notes pain in the left lateral elbow for years that he thinks is tennis elbow. The elbow pain is mildly bothersome, but the shoulder is worse. Does pretty regular lifting at the gym with his personal trainer  and notes pain with shoulder and chest press. Also pain when reaching behind him. No previous shoulder imaging or surgeries. Hasn't really taken medications for this and would want to avoid injections or surgery if at all possible.   Pertinent ROS were reviewed with the patient and found to be negative unless otherwise specified above in HPI.   PERTINENT  PMH / PSH / FH / SH:  Past Medical, Surgical, Social, and Family History Reviewed & Updated in the EMR.  Pertinent findings include:  Non-contributory Previously used Viagra for ED, though no longer taking this  No Known Allergies  OBJECTIVE:  BP 130/80   Ht 6\' 4"  (1.93 m)   Wt 200 lb (90.7 kg)   BMI 24.34 kg/m   PHYSICAL EXAM:  GEN: Alert and Oriented, NAD, comfortable in exam room RESP: Unlabored respirations, symmetric chest rise PSY: normal mood, congruent affect   LEFT SHOULDER MSK EXAM: No swelling, ecchymoses.  No gross deformity. No TTP. FROM. Mildly + painful arch, negative drop arm. Strength 4+/5 with empty can and resisted internal/external rotation all of which reproduce mild pain. Negative Hawkins, Neers. Negative Speeds, Yergasons and Obriens. Negative apprehension. NV intact distally.   LEFT ELBOW MSK EXAM: No gross bony abnormality, ecchymosis or edema FROM and strength 5/5 on flexion, extension, supination,  pronation, and grip.  Pain elicited w/ resisted wrist extension Medial epicondyle: Non-tender Lateral epicondyle: Tender No sensory deficits Radial pulses 2+, cap refill <2s Assessment & Plan Chronic left shoulder pain Exam most consistent with rotator cuff strain with likely small partial thickness tearing. He declined U/S and would like to avoid injections and surgery, which based on his presentation I think is reasonable.  Plan: - Limitation of overhead lifting for 6 weeks with max 5# weight limit and avoidance of activity causing pain > 3/10 - HEP for rotator cuff strengthening and theraband provided and reviewed today - Rx for NG protocol as well with goal to start at 1/4 patch at the shoulder and hopefully include the elbow as well if tolerated. Reviewed side effects and mitigation strategies. Hx of viagra use, no longer taking - reviewed avoidance of combination with NG. - F/u in 6-8 weeks, sooner if issues arise. If not improving would recommend imaging starting with U/S +/- X-ray Lateral epicondylitis of left elbow Mild presentation. Reviewed HEP for this as well as NG protocol, see above.   Glean Salen, MD PGY-4, Sports Medicine Fellow [facility]
